# Patient Record
Sex: Female | Born: 2000 | Race: Black or African American | Hispanic: No | Marital: Single | Smoking: Never smoker
Health system: Southern US, Community
[De-identification: ages and names within clinical notes are randomized; demographics above are authoritative.]

## PROBLEM LIST (undated history)

## (undated) DIAGNOSIS — Z2839 Other underimmunization status: Secondary | ICD-10-CM

## (undated) DIAGNOSIS — J45909 Unspecified asthma, uncomplicated: Secondary | ICD-10-CM

## (undated) DIAGNOSIS — Z283 Underimmunization status: Secondary | ICD-10-CM

---
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
## 2019-04-18 ENCOUNTER — Ambulatory Visit (HOSPITAL_COMMUNITY)
Admission: EM | Admit: 2019-04-18 | Discharge: 2019-04-18 | Disposition: A | Discharge status: Home / Self Care | Payer: Medicaid Other | Attending: Family Medicine | Admitting: Family Medicine

## 2019-04-18 ENCOUNTER — Encounter (HOSPITAL_COMMUNITY): Payer: Self-pay

## 2019-04-18 ENCOUNTER — Other Ambulatory Visit: Payer: Self-pay

## 2019-04-18 DIAGNOSIS — S61303A Unspecified open wound of left middle finger with damage to nail, initial encounter: Secondary | ICD-10-CM

## 2019-04-18 DIAGNOSIS — S61302A Unspecified open wound of right middle finger with damage to nail, initial encounter: Secondary | ICD-10-CM | POA: Diagnosis not present

## 2019-04-18 DIAGNOSIS — S61300A Unspecified open wound of right index finger with damage to nail, initial encounter: Secondary | ICD-10-CM

## 2019-04-18 DIAGNOSIS — S61309A Unspecified open wound of unspecified finger with damage to nail, initial encounter: Secondary | ICD-10-CM

## 2019-04-18 DIAGNOSIS — S61304A Unspecified open wound of right ring finger with damage to nail, initial encounter: Secondary | ICD-10-CM

## 2019-04-18 MED ORDER — CEFADROXIL 500 MG PO CAPS: 500.0000 mg | ORAL_CAPSULE | Freq: Two times a day (BID) | ORAL | 0 refills | Status: DC

## 2019-04-18 NOTE — Discharge Instructions (Signed)
Take antibiotic 2 times a day for 5 days.  This is to help prevent infection Take Tylenol or ibuprofen as needed for pain Clean daily and apply a dressing.  Do this for the next several days until the sensitivity of the skin goes down.  After this you can leave them open to air Return here or to your primary care doctor if there is any difficulty with healing over time
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
## 2019-04-24 ENCOUNTER — Other Ambulatory Visit: Payer: Self-pay

## 2019-04-24 ENCOUNTER — Emergency Department (HOSPITAL_COMMUNITY)
Admission: EM | Admit: 2019-04-24 | Discharge: 2019-04-24 | Disposition: A | Discharge status: Home / Self Care | Payer: Medicaid Other | Attending: Emergency Medicine | Admitting: Emergency Medicine

## 2019-04-24 DIAGNOSIS — S61309D Unspecified open wound of unspecified finger with damage to nail, subsequent encounter: Secondary | ICD-10-CM | POA: Insufficient documentation
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
## 2019-04-24 NOTE — Discharge Instructions (Addendum)
You were evaluated in the Emergency Department and after careful evaluation, we did not find any emergent condition requiring admission or further testing in the hospital.  Your exam/testing today is overall reassuring.  Please follow-up with the plastic surgery specialists to help your nails heal.  Use Tylenol or Motrin as needed for discomfort.  Please return to the Emergency Department if you experience any worsening of your condition.  We encourage you to follow up with a primary care provider.  Thank you for allowing [place] to be a part of your care.

## 2019-04-24 NOTE — ED Notes (Signed)
Patient verbalized understanding of discharge instructions. Opportunities for questioning and answers were provided. Armband removed by staff. Patient removed from ED.   
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
## 2019-04-26 ENCOUNTER — Other Ambulatory Visit: Payer: Self-pay

## 2019-04-26 ENCOUNTER — Ambulatory Visit (INDEPENDENT_AMBULATORY_CARE_PROVIDER_SITE_OTHER): Payer: Medicaid Other | Admitting: Plastic Surgery

## 2019-04-26 ENCOUNTER — Encounter: Payer: Self-pay | Admitting: Plastic Surgery

## 2019-04-26 VITALS — BP 121/73 | HR 74 | Temp 98.0°F | Ht 67.0 in | Wt 139.2 lb

## 2019-04-26 DIAGNOSIS — S61309A Unspecified open wound of unspecified finger with damage to nail, initial encounter: Secondary | ICD-10-CM

## 2019-04-26 DIAGNOSIS — S6990XS Unspecified injury of unspecified wrist, hand and finger(s), sequela: Secondary | ICD-10-CM
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
## 2019-05-19 ENCOUNTER — Emergency Department (HOSPITAL_COMMUNITY)
Admission: EM | Admit: 2019-05-19 | Discharge: 2019-05-20 | Discharge status: Against Medical Advice | Payer: Medicaid Other | Attending: Emergency Medicine | Admitting: Emergency Medicine

## 2019-05-19 ENCOUNTER — Other Ambulatory Visit: Payer: Self-pay

## 2019-05-19 ENCOUNTER — Encounter (HOSPITAL_COMMUNITY): Payer: Self-pay

## 2019-05-19 DIAGNOSIS — Z5321 Procedure and treatment not carried out due to patient leaving prior to being seen by health care provider: Secondary | ICD-10-CM | POA: Diagnosis not present

## 2019-05-19 DIAGNOSIS — K6289 Other specified diseases of anus and rectum: Secondary | ICD-10-CM | POA: Diagnosis present

## 2019-05-19 NOTE — ED Triage Notes (Signed)
Pt c/o rectal pain.  No known cause.

## 2019-05-20 NOTE — ED Notes (Signed)
No call for VS x2 

## 2019-05-20 NOTE — ED Notes (Signed)
No call for VS x1. 

## 2019-05-20 NOTE — ED Notes (Signed)
Called pt name multiple times with no response.

## 2019-07-28 ENCOUNTER — Ambulatory Visit: Payer: Medicaid Other | Admitting: Plastic Surgery

## 2019-09-14 ENCOUNTER — Encounter (HOSPITAL_COMMUNITY): Payer: Self-pay

## 2019-09-14 ENCOUNTER — Ambulatory Visit (HOSPITAL_COMMUNITY)
Admission: EM | Admit: 2019-09-14 | Discharge: 2019-09-14 | Disposition: A | Discharge status: Home / Self Care | Payer: Medicaid Other | Attending: Urgent Care | Admitting: Urgent Care

## 2019-09-14 ENCOUNTER — Other Ambulatory Visit: Payer: Self-pay

## 2019-09-14 DIAGNOSIS — J9801 Acute bronchospasm: Secondary | ICD-10-CM | POA: Diagnosis not present

## 2019-09-14 DIAGNOSIS — Z7901 Long term (current) use of anticoagulants: Secondary | ICD-10-CM | POA: Diagnosis not present

## 2019-09-14 DIAGNOSIS — Z20822 Contact with and (suspected) exposure to covid-19: Secondary | ICD-10-CM | POA: Diagnosis not present

## 2019-09-14 DIAGNOSIS — R05 Cough: Secondary | ICD-10-CM | POA: Diagnosis present

## 2019-09-14 DIAGNOSIS — R059 Cough, unspecified: Secondary | ICD-10-CM

## 2019-09-14 DIAGNOSIS — R0982 Postnasal drip: Secondary | ICD-10-CM

## 2019-09-14 DIAGNOSIS — J4530 Mild persistent asthma, uncomplicated: Secondary | ICD-10-CM

## 2019-09-14 DIAGNOSIS — R062 Wheezing: Secondary | ICD-10-CM

## 2019-09-14 MED ORDER — BENZONATATE 100 MG PO CAPS: 100.0000 mg | ORAL_CAPSULE | Freq: Three times a day (TID) | ORAL | 0 refills | Status: DC | PRN

## 2019-09-14 MED ORDER — PSEUDOEPHEDRINE HCL 60 MG PO TABS: 60.0000 mg | ORAL_TABLET | Freq: Three times a day (TID) | ORAL | 0 refills | Status: DC | PRN

## 2019-09-14 MED ORDER — LEVOCETIRIZINE DIHYDROCHLORIDE 5 MG PO TABS: 5.0000 mg | ORAL_TABLET | Freq: Every evening | ORAL | 0 refills | Status: DC

## 2019-09-14 MED ORDER — ALBUTEROL SULFATE HFA 108 (90 BASE) MCG/ACT IN AERS: 1.0000 | INHALATION_SPRAY | Freq: Four times a day (QID) | RESPIRATORY_TRACT | 0 refills | Status: DC | PRN

## 2019-09-14 MED ORDER — PROMETHAZINE-DM 6.25-15 MG/5ML PO SYRP: 5.0000 mL | ORAL_SOLUTION | Freq: Every evening | ORAL | 0 refills | Status: DC | PRN

## 2019-09-14 NOTE — Discharge Instructions (Signed)
Start taking Xyzal daily to address post-nasal drainage as a source of your cough. Use Sudafed to help with the same. This is an as needed medication but start using it the next 3-4 days and then only as needed. In the meantime, you can use Tessalon and Promethazine cough medications. Use your albuterol inhaler once every 4-6 hours as needed for bronchospasms, wheezing. We'll let you know about your COVID results through mychart.

## 2019-09-14 NOTE — ED Triage Notes (Signed)
Pt states she has a cough x 1 week.

## 2019-09-14 NOTE — ED Provider Notes (Signed)
[facility]   MRN: [id number] DOB: 2001/02/27  Subjective:   Holly Johnston is a 19 y.o. female presenting for 1 week history of persisting cough, intermittent wheezing.  Patient has a history of asthma, is out of her albuterol inhaler.  She denies having a sick type feeling.  However, does admit a scratchy throat.  Denies COVID-19 contacts.  Patient is in school now.  No current facility-administered medications for this encounter.  Current Outpatient Medications:  .  cefadroxil (DURICEF) 500 MG capsule, Take 1 capsule (500 mg total) by mouth 2 (two) times daily., Disp: 10 capsule, Rfl: 0 .  hydrocortisone 2.5 % cream, APPLY DAILY TO THE AFFECTED AREAS ON FACE, Disp: , Rfl:  .  triamcinolone cream (KENALOG) 0.1 %, Apply topically See admin instructions., Disp: , Rfl:    Allergies  Allergen Reactions  . Amoxicillin Rash    Past Medical History:  Diagnosis Date  . Asthma   . Not immunized      No past surgical history on file.  Family History  Problem Relation Age of Onset  . Healthy Mother   . Healthy Father     Social History   Tobacco Use  . Smoking status: Never Smoker  . Smokeless tobacco: Never Used  Substance Use Topics  . Alcohol use: No  . Drug use: No    Review of Systems  Constitutional: Negative for fever and malaise/fatigue.  HENT: Negative for congestion, ear pain, sinus pain and sore throat.   Eyes: Negative for discharge and redness.  Respiratory: Positive for cough and wheezing. Negative for hemoptysis and shortness of breath.   Cardiovascular: Negative for chest pain.  Gastrointestinal: Negative for abdominal pain, diarrhea, nausea and vomiting.  Genitourinary: Negative for dysuria, flank pain and hematuria.  Musculoskeletal: Negative for myalgias.  Skin: Negative for rash.  Neurological: Negative for dizziness, weakness and headaches.  Psychiatric/Behavioral: Negative for depression and substance abuse.     Objective:    Vitals: BP 125/78 (BP Location: Right Arm)   Pulse 74   Temp 98.9 F (37.2 C) (Oral)   Resp 18   Wt 137 lb (62.1 kg)   LMP 08/20/2019   SpO2 100%   BMI 21.46 kg/m   Physical Exam Constitutional:      General: She is not in acute distress.    Appearance: Normal appearance. She is well-developed. She is not ill-appearing, toxic-appearing or diaphoretic.  HENT:     Head: Normocephalic and atraumatic.     Right Ear: Tympanic membrane and ear canal normal. No drainage or tenderness. No middle ear effusion. Tympanic membrane is not erythematous.     Left Ear: Tympanic membrane and ear canal normal. No drainage or tenderness.  No middle ear effusion. Tympanic membrane is not erythematous.     Nose: Nose normal. No congestion or rhinorrhea.     Mouth/Throat:     Mouth: Mucous membranes are moist. No oral lesions.     Pharynx: No pharyngeal swelling, oropharyngeal exudate, posterior oropharyngeal erythema or uvula swelling.     Tonsils: No tonsillar exudate or tonsillar abscesses.     Comments: Thick streak of post-nasal drainage overlying pharynx.  Eyes:     General: No scleral icterus.    Extraocular Movements: Extraocular movements intact.     Right eye: Normal extraocular motion.     Left eye: Normal extraocular motion.     Conjunctiva/sclera: Conjunctivae normal.     Pupils: Pupils are equal, round, and reactive to light.  Cardiovascular:     Rate and Rhythm: Normal rate and regular rhythm.     Pulses: Normal pulses.     Heart sounds: Normal heart sounds. No murmur. No friction rub. No gallop.   Pulmonary:     Effort: Pulmonary effort is normal. No respiratory distress.     Breath sounds: No stridor. Wheezing (mild, over mid lung fields bilaterally) present. No rhonchi or rales.  Musculoskeletal:     Cervical back: Normal range of motion and neck supple.  Lymphadenopathy:     Cervical: No cervical adenopathy.  Skin:    General: Skin is warm and dry.     Findings: No  rash.  Neurological:     General: No focal deficit present.     Mental Status: She is alert and oriented to person, place, and time.  Psychiatric:        Mood and Affect: Mood normal.        Behavior: Behavior normal.        Thought Content: Thought content normal.        Judgment: Judgment normal.      Assessment and Plan :   1. Cough   2. Wheezing   3. Bronchospasm   4. Mild persistent asthma without complication   5. Post-nasal drainage     COVID-19 testing pending.  Suspect patient is having bronchospasms and postnasal drainage related to her asthma and seasonal change.  Recommended supportive care, refilled her albuterol inhaler.  Due to reassuring physical exam findings, hold off on chest x-ray. Counseled patient on potential for adverse effects with medications prescribed/recommended today, ER and return-to-clinic precautions discussed, patient verbalized understanding.    Jaynee Eagles, PA-C 09/14/19 1236

## 2019-09-15 LAB — SARS CORONAVIRUS 2 (TAT 6-24 HRS): SARS Coronavirus 2: NEGATIVE

## 2019-12-09 DIAGNOSIS — Z419 Encounter for procedure for purposes other than remedying health state, unspecified: Secondary | ICD-10-CM | POA: Diagnosis not present

## 2020-01-09 DIAGNOSIS — Z419 Encounter for procedure for purposes other than remedying health state, unspecified: Secondary | ICD-10-CM | POA: Diagnosis not present

## 2020-02-09 DIAGNOSIS — Z419 Encounter for procedure for purposes other than remedying health state, unspecified: Secondary | ICD-10-CM | POA: Diagnosis not present

## 2020-03-10 DIAGNOSIS — Z419 Encounter for procedure for purposes other than remedying health state, unspecified: Secondary | ICD-10-CM | POA: Diagnosis not present

## 2020-04-10 DIAGNOSIS — Z419 Encounter for procedure for purposes other than remedying health state, unspecified: Secondary | ICD-10-CM | POA: Diagnosis not present

## 2020-04-12 ENCOUNTER — Other Ambulatory Visit: Payer: Self-pay

## 2020-04-12 ENCOUNTER — Encounter (HOSPITAL_COMMUNITY): Payer: Self-pay | Admitting: Emergency Medicine

## 2020-04-12 ENCOUNTER — Emergency Department (HOSPITAL_COMMUNITY)
Admission: EM | Admit: 2020-04-12 | Discharge: 2020-04-12 | Disposition: A | Discharge status: Home / Self Care | Payer: Medicaid Other | Attending: Emergency Medicine | Admitting: Emergency Medicine

## 2020-04-12 DIAGNOSIS — Z202 Contact with and (suspected) exposure to infections with a predominantly sexual mode of transmission: Secondary | ICD-10-CM | POA: Insufficient documentation

## 2020-04-12 DIAGNOSIS — J45909 Unspecified asthma, uncomplicated: Secondary | ICD-10-CM | POA: Insufficient documentation

## 2020-04-12 DIAGNOSIS — Z113 Encounter for screening for infections with a predominantly sexual mode of transmission: Secondary | ICD-10-CM

## 2020-04-12 LAB — URINALYSIS, ROUTINE W REFLEX MICROSCOPIC
Bilirubin Urine: NEGATIVE
Glucose, UA: NEGATIVE mg/dL
Hgb urine dipstick: NEGATIVE
Ketones, ur: NEGATIVE mg/dL
Nitrite: NEGATIVE
Protein, ur: 30 mg/dL — AB
Specific Gravity, Urine: 1.028 (ref 1.005–1.030)
pH: 8 (ref 5.0–8.0)

## 2020-04-12 LAB — HIV ANTIBODY (ROUTINE TESTING W REFLEX): HIV Screen 4th Generation wRfx: NONREACTIVE

## 2020-04-12 LAB — I-STAT BETA HCG BLOOD, ED (MC, WL, AP ONLY): I-stat hCG, quantitative: <5 m[IU]/mL (ref <5)

## 2020-04-12 NOTE — Discharge Instructions (Addendum)
You have been screened for potential sexually transmitted infection.  Check MyChart, link below in the next several days for results.

## 2020-04-12 NOTE — ED Triage Notes (Signed)
Pt states she would like to be tested for STDs. Pt states the only way she would like to be tested in by getting blood drawn.

## 2020-04-12 NOTE — ED Notes (Signed)
Lab called and stated STD swab was not labeled and they cannot run the test, Patient already gone from the department.
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
## 2020-04-13 LAB — RPR: RPR Ser Ql: NONREACTIVE

## 2020-04-21 DIAGNOSIS — Z13220 Encounter for screening for lipoid disorders: Secondary | ICD-10-CM | POA: Diagnosis not present

## 2020-04-21 DIAGNOSIS — Z131 Encounter for screening for diabetes mellitus: Secondary | ICD-10-CM | POA: Diagnosis not present

## 2020-04-21 DIAGNOSIS — N760 Acute vaginitis: Secondary | ICD-10-CM | POA: Diagnosis not present

## 2020-04-21 DIAGNOSIS — Z0000 Encounter for general adult medical examination without abnormal findings: Secondary | ICD-10-CM | POA: Diagnosis not present

## 2020-04-21 DIAGNOSIS — S0341XD Sprain of jaw, right side, subsequent encounter: Secondary | ICD-10-CM | POA: Diagnosis not present

## 2020-04-21 DIAGNOSIS — J4520 Mild intermittent asthma, uncomplicated: Secondary | ICD-10-CM | POA: Diagnosis not present

## 2020-04-26 DIAGNOSIS — M26601 Right temporomandibular joint disorder, unspecified: Secondary | ICD-10-CM | POA: Diagnosis not present

## 2020-04-26 DIAGNOSIS — E559 Vitamin D deficiency, unspecified: Secondary | ICD-10-CM | POA: Diagnosis not present

## 2020-04-26 DIAGNOSIS — N760 Acute vaginitis: Secondary | ICD-10-CM | POA: Diagnosis not present

## 2020-04-26 DIAGNOSIS — Z131 Encounter for screening for diabetes mellitus: Secondary | ICD-10-CM | POA: Diagnosis not present

## 2020-04-26 DIAGNOSIS — Z0000 Encounter for general adult medical examination without abnormal findings: Secondary | ICD-10-CM | POA: Diagnosis not present

## 2020-04-26 DIAGNOSIS — Z13220 Encounter for screening for lipoid disorders: Secondary | ICD-10-CM | POA: Diagnosis not present

## 2020-05-10 DIAGNOSIS — Z419 Encounter for procedure for purposes other than remedying health state, unspecified: Secondary | ICD-10-CM | POA: Diagnosis not present

## 2020-05-10 DIAGNOSIS — M26601 Right temporomandibular joint disorder, unspecified: Secondary | ICD-10-CM | POA: Diagnosis not present

## 2020-05-10 DIAGNOSIS — J4520 Mild intermittent asthma, uncomplicated: Secondary | ICD-10-CM | POA: Diagnosis not present

## 2020-06-10 DIAGNOSIS — Z419 Encounter for procedure for purposes other than remedying health state, unspecified: Secondary | ICD-10-CM | POA: Diagnosis not present

## 2020-07-11 DIAGNOSIS — Z419 Encounter for procedure for purposes other than remedying health state, unspecified: Secondary | ICD-10-CM | POA: Diagnosis not present

## 2020-08-01 ENCOUNTER — Encounter (HOSPITAL_COMMUNITY): Payer: Self-pay | Admitting: Emergency Medicine

## 2020-08-01 ENCOUNTER — Other Ambulatory Visit: Payer: Self-pay

## 2020-08-01 ENCOUNTER — Emergency Department (HOSPITAL_COMMUNITY)
Admission: EM | Admit: 2020-08-01 | Discharge: 2020-08-02 | Disposition: A | Discharge status: Home / Self Care | Payer: Commercial Managed Care - PPO | Attending: Emergency Medicine | Admitting: Emergency Medicine

## 2020-08-01 DIAGNOSIS — H1089 Other conjunctivitis: Secondary | ICD-10-CM | POA: Insufficient documentation

## 2020-08-01 DIAGNOSIS — H02841 Edema of right upper eyelid: Secondary | ICD-10-CM | POA: Diagnosis present

## 2020-08-01 DIAGNOSIS — H109 Unspecified conjunctivitis: Secondary | ICD-10-CM

## 2020-08-01 DIAGNOSIS — J45909 Unspecified asthma, uncomplicated: Secondary | ICD-10-CM | POA: Insufficient documentation

## 2020-08-01 MED ORDER — FLUORESCEIN SODIUM 1 MG OP STRP
1.0000 | ORAL_STRIP | Freq: Once | OPHTHALMIC | Status: DC
  Filled 2020-08-01: qty 1

## 2020-08-01 MED ORDER — TETRACAINE HCL 0.5 % OP SOLN
2.0000 [drp] | Freq: Once | OPHTHALMIC | Status: DC
  Filled 2020-08-01: qty 4

## 2020-08-01 NOTE — ED Triage Notes (Signed)
Patient reports right eye irritation from her eyelashes this morning with redness /drainage and blurred vision .
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
## 2020-08-02 DIAGNOSIS — H1089 Other conjunctivitis: Secondary | ICD-10-CM | POA: Diagnosis not present

## 2020-08-02 LAB — GC/CHLAMYDIA PROBE AMP (~~LOC~~) NOT AT ARMC
Chlamydia: NEGATIVE
Comment: NEGATIVE
Comment: NORMAL
Neisseria Gonorrhea: NEGATIVE

## 2020-08-02 MED ORDER — DOXYCYCLINE HYCLATE 100 MG PO TABS
100.0000 mg | ORAL_TABLET | Freq: Once | ORAL | Status: AC
  Administered 2020-08-02: 100 mg via ORAL
  Filled 2020-08-02: qty 1

## 2020-08-02 MED ORDER — GATIFLOXACIN 0.5 % OP SOLN
1.0000 [drp] | Freq: Four times a day (QID) | OPHTHALMIC | Status: DC
  Administered 2020-08-02: 1 [drp] via OPHTHALMIC
  Filled 2020-08-02: qty 2.5

## 2020-08-02 MED ORDER — DOXYCYCLINE HYCLATE 100 MG PO CAPS: 100.0000 mg | ORAL_CAPSULE | Freq: Two times a day (BID) | ORAL | 0 refills | Status: DC

## 2020-08-02 NOTE — ED Notes (Signed)
Pt verbalized understanding of d/c instructions, medication and follow up. Pt ambulatory to WR with steady gait, NAD

## 2020-08-08 DIAGNOSIS — Z419 Encounter for procedure for purposes other than remedying health state, unspecified: Secondary | ICD-10-CM | POA: Diagnosis not present

## 2020-09-08 DIAGNOSIS — Z419 Encounter for procedure for purposes other than remedying health state, unspecified: Secondary | ICD-10-CM | POA: Diagnosis not present

## 2020-10-08 DIAGNOSIS — Z419 Encounter for procedure for purposes other than remedying health state, unspecified: Secondary | ICD-10-CM | POA: Diagnosis not present

## 2020-11-08 DIAGNOSIS — Z419 Encounter for procedure for purposes other than remedying health state, unspecified: Secondary | ICD-10-CM | POA: Diagnosis not present

## 2020-12-08 DIAGNOSIS — Z419 Encounter for procedure for purposes other than remedying health state, unspecified: Secondary | ICD-10-CM | POA: Diagnosis not present

## 2020-12-27 ENCOUNTER — Emergency Department (HOSPITAL_COMMUNITY)
Admission: EM | Admit: 2020-12-27 | Discharge: 2020-12-28 | Disposition: A | Discharge status: Home / Self Care | Payer: Medicaid Other | Attending: Emergency Medicine | Admitting: Emergency Medicine

## 2020-12-27 ENCOUNTER — Other Ambulatory Visit: Payer: Self-pay

## 2020-12-27 ENCOUNTER — Emergency Department (HOSPITAL_COMMUNITY): Payer: Medicaid Other

## 2020-12-27 ENCOUNTER — Encounter (HOSPITAL_COMMUNITY): Payer: Self-pay | Admitting: Emergency Medicine

## 2020-12-27 DIAGNOSIS — M79672 Pain in left foot: Secondary | ICD-10-CM | POA: Insufficient documentation

## 2020-12-27 DIAGNOSIS — M7989 Other specified soft tissue disorders: Secondary | ICD-10-CM | POA: Diagnosis not present

## 2020-12-27 DIAGNOSIS — M79671 Pain in right foot: Secondary | ICD-10-CM | POA: Diagnosis not present

## 2020-12-27 DIAGNOSIS — R200 Anesthesia of skin: Secondary | ICD-10-CM | POA: Insufficient documentation

## 2020-12-27 DIAGNOSIS — Z5321 Procedure and treatment not carried out due to patient leaving prior to being seen by health care provider: Secondary | ICD-10-CM | POA: Insufficient documentation

## 2020-12-27 LAB — PREGNANCY, URINE: Preg Test, Ur: NEGATIVE
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
## 2020-12-27 NOTE — ED Triage Notes (Signed)
Patient coming from home, complaint of numbness in the feet and blood coming thorough skin on top of feet.

## 2020-12-28 ENCOUNTER — Encounter (HOSPITAL_COMMUNITY): Payer: Self-pay

## 2020-12-28 ENCOUNTER — Ambulatory Visit (HOSPITAL_COMMUNITY)
Admission: EM | Admit: 2020-12-28 | Discharge: 2020-12-28 | Disposition: A | Discharge status: Home / Self Care | Payer: Medicaid Other | Attending: Emergency Medicine | Admitting: Emergency Medicine

## 2020-12-28 ENCOUNTER — Other Ambulatory Visit: Payer: Self-pay

## 2020-12-28 DIAGNOSIS — M722 Plantar fascial fibromatosis: Secondary | ICD-10-CM

## 2020-12-28 MED ORDER — PREDNISONE 20 MG PO TABS: 40.0000 mg | ORAL_TABLET | Freq: Every day | ORAL | 0 refills | Status: DC

## 2020-12-28 NOTE — Discharge Instructions (Addendum)
Take prednisone  (2 Pills) every morning with food for 5 days, at completion take ibuprofen 800 mg three times a day with food then can use as need  Inside your packet are exercises that can be done to help stretch the fascia, may start once the pain starts to subside   Can apply heat compresses or pad to area in 15 minutes intervals  Elevate feet with in sitting and lying position to help with swelling  Water comfortable supportive shoes until symptoms subside

## 2020-12-28 NOTE — ED Provider Notes (Signed)
[facility]    CSN: [id number] Arrival date & time: 12/28/20  2703      History   Chief Complaint Chief Complaint  Patient presents with   Foot Swelling    HPI Holly Johnston is a 20 y.o. female.   Patient presents with bilateral foot pain and swelling with numbness starting at the midfoot radiating into all five toes for three days after wearing stiletto heels intermittently for three days straight. Also feel several times, unknown number, while in heels. Has scattered abrasions and bruising Painful to bear weight and has limited movement of toes.   Past Medical History:  Diagnosis Date   Asthma    Not immunized     Patient Active Problem List   Diagnosis Date Noted   Not immunized 10/28/2014    History reviewed. No pertinent surgical history.  OB History   No obstetric history on file.      Home Medications    Prior to Admission medications   Medication Sig Start Date End Date Taking? Authorizing Provider  predniSONE (DELTASONE) 20 MG tablet Take 2 tablets (40 mg total) by mouth daily. 12/28/20  Yes Lavante Toso R, NP  albuterol (VENTOLIN HFA) 108 (90 Base) MCG/ACT inhaler Inhale 1-2 puffs into the lungs every 6 (six) hours as needed for wheezing or shortness of breath. 09/14/19   Wallis Bamberg, PA-C  benzonatate (TESSALON) 100 MG capsule Take 1-2 capsules (100-200 mg total) by mouth 3 (three) times daily as needed. 09/14/19   Wallis Bamberg, PA-C  cefadroxil (DURICEF) 500 MG capsule Take 1 capsule (500 mg total) by mouth 2 (two) times daily. 04/18/19   Eustace Moore, MD  doxycycline (VIBRAMYCIN) 100 MG capsule Take 1 capsule (100 mg total) by mouth 2 (two) times daily. 08/02/20   Gilda Crease, MD  hydrocortisone 2.5 % cream APPLY DAILY TO THE AFFECTED AREAS ON FACE 01/21/19   [provider]  levocetirizine (XYZAL) 5 MG tablet Take 1 tablet (5 mg total) by mouth every evening. 09/14/19   Wallis Bamberg, PA-C  promethazine-dextromethorphan  (PROMETHAZINE-DM) 6.25-15 MG/5ML syrup Take 5 mLs by mouth at bedtime as needed for cough. 09/14/19   Wallis Bamberg, PA-C  pseudoephedrine (SUDAFED) 60 MG tablet Take 1 tablet (60 mg total) by mouth every 8 (eight) hours as needed for congestion. 09/14/19   Wallis Bamberg, PA-C  triamcinolone cream (KENALOG) 0.1 % Apply topically See admin instructions. 02/25/19   [provider]  omeprazole (PRILOSEC) 20 MG capsule Take 1 capsule (20 mg total) by mouth daily. 03/07/17 04/18/19  Sherrilee Gilles, NP    Family History Family History  Problem Relation Age of Onset   Healthy Mother    Healthy Father     Social History Social History   Tobacco Use   Smoking status: Never   Smokeless tobacco: Never  Vaping Use   Vaping Use: Never used  Substance Use Topics   Alcohol use: No   Drug use: No     Allergies   Amoxicillin   Review of Systems Review of Systems Defer to HPI   Physical Exam Triage Vital Signs ED Triage Vitals  Enc Vitals Group     BP 12/28/20 0836 132/77     Pulse Rate 12/28/20 0836 78     Resp 12/28/20 0836 18     Temp 12/28/20 0836 98.3 F (36.8 C)     Temp Source 12/28/20 0836 Oral     SpO2 12/28/20 0836 100 %  Weight --      Height --      Head Circumference --      Peak Flow --      Pain Score 12/28/20 0835 10     Pain Loc --      Pain Edu? --      Excl. in GC? --    No data found.  Updated Vital Signs BP 132/77 (BP Location: Left Arm)   Pulse 78   Temp 98.3 F (36.8 C) (Oral)   Resp 18   LMP 12/02/2020 (Exact Date)   SpO2 100%   Visual Acuity Right Eye Distance:   Left Eye Distance:   Bilateral Distance:    Right Eye Near:   Left Eye Near:    Bilateral Near:     Physical Exam Constitutional:      Appearance: Normal appearance.  HENT:     Head: Normocephalic.  Eyes:     Extraocular Movements: Extraocular movements intact.  Pulmonary:     Effort: Pulmonary effort is normal.  Musculoskeletal:       Legs:     Comments:  Diffuse bilateral swelling of ankle and feet, bilateral tenderness of central forefoot, decreased sensation along all five toes bilaterally, capillary refill less than 3, 2+ pedal pulses bilaterally, abrasions on bilateral posterior ankles, small blister at base of right metatarsal, small abrasion on near base of right metatarsal, ecchymosis on medial left midfoot, ROM intact of bilateral ankles, ROM intact of bilateral foot   Neurological:     General: No focal deficit present.     Mental Status: She is alert and oriented to person, place, and time. Mental status is at baseline.  Psychiatric:        Mood and Affect: Mood normal.        Behavior: Behavior normal.     UC Treatments / Results  Labs (all labs ordered are listed, but only abnormal results are displayed) Labs Reviewed - No data to display  EKG   Radiology DG Foot Complete Left  Result Date: 12/27/2020 CLINICAL DATA:  Left foot pain and swelling EXAM: LEFT FOOT - COMPLETE 3+ VIEW COMPARISON:  None. FINDINGS: There is no evidence of fracture or dislocation. There is no evidence of arthropathy or other focal bone abnormality. Soft tissues are unremarkable. IMPRESSION: Negative. Electronically Signed   By: Helyn Numbers MD   On: 12/27/2020 19:23   DG Foot Complete Right  Result Date: 12/27/2020 CLINICAL DATA:  Right foot pain and swelling EXAM: RIGHT FOOT COMPLETE - 3+ VIEW COMPARISON:  None. FINDINGS: There is no evidence of fracture or dislocation. There is no evidence of arthropathy or other focal bone abnormality. Soft tissues are unremarkable. IMPRESSION: Negative. Electronically Signed   By: Helyn Numbers MD   On: 12/27/2020 19:22    Procedures Procedures (including critical care time)  Medications Ordered in UC Medications - No data to display  Initial Impression / Assessment and Plan / UC Course  I have reviewed the triage vital signs and the nursing notes.  Pertinent labs & imaging results that were available  during my care of the patient were reviewed by me and considered in my medical decision making (see chart for details).  Bilateral plantar fasciitis  Prednisone 40 mg daily  for 5 days, at completion ibuprofen 800 mg tid for 5 days then prn Heating pads to areas in 15 minute intervals Stretching of fascia once pain decreases Elevation of feet with dependent  Supportive padded footwear  until pain subsides   Final Clinical Impressions(s) / UC Diagnoses   Final diagnoses:  Bilateral plantar fasciitis     Discharge Instructions      Take prednisone  (2 Pills) every morning with food for 5 days, at completion take ibuprofen 800 mg three times a day with food then can use as need  Inside your packet are exercises that can be done to help stretch the fascia, may start once the pain starts to subside   Can apply heat compresses or pad to area in 15 minutes intervals  Elevate feet with in sitting and lying position to help with swelling  Water comfortable supportive shoes until symptoms subside    ED Prescriptions     Medication Sig Dispense Auth. Provider   predniSONE (DELTASONE) 20 MG tablet Take 2 tablets (40 mg total) by mouth daily. 10 tablet Valinda Hoar, NP      PDMP not reviewed this encounter.   Valinda Hoar, [place] 12/28/20 [phone]

## 2020-12-28 NOTE — ED Notes (Signed)
Pt did not respond when called for vitals check, not seen in or outside of lobby  

## 2020-12-28 NOTE — ED Notes (Signed)
Pt did not respond during vitals check X1 

## 2020-12-28 NOTE — ED Triage Notes (Addendum)
Pt in with c/o bilateral foot swelling and pain x 3 days  Pt states her feet are swelling and she has also noticed some bleeding   States she wore high heels for 3 days straight and fell a few times, but doesn't remember actually injuring herself  Pt states she does feel like her toes are numb and she has limited ROM in feet

## 2021-01-02 ENCOUNTER — Other Ambulatory Visit: Payer: Self-pay

## 2021-01-02 ENCOUNTER — Ambulatory Visit (INDEPENDENT_AMBULATORY_CARE_PROVIDER_SITE_OTHER): Payer: Medicaid Other | Admitting: Orthopaedic Surgery

## 2021-01-02 ENCOUNTER — Other Ambulatory Visit: Payer: Self-pay | Admitting: Physician Assistant

## 2021-01-02 DIAGNOSIS — M79672 Pain in left foot: Secondary | ICD-10-CM | POA: Diagnosis not present

## 2021-01-02 DIAGNOSIS — M79671 Pain in right foot: Secondary | ICD-10-CM

## 2021-01-02 MED ORDER — DICLOFENAC SODIUM 75 MG PO TBEC: 75.0000 mg | DELAYED_RELEASE_TABLET | Freq: Two times a day (BID) | ORAL | 0 refills | Status: DC | PRN

## 2021-01-02 NOTE — Progress Notes (Signed)
   Office Visit Note   Patient: Holly Johnston           Date of Birth: 01-29-2001           MRN: [id number] Visit Date: 01/02/2021              Requested by: [facility] [address] [place],  [place] [address] PCP: [facility]   Assessment & Plan: Visit Diagnoses:  1. Bilateral foot pain     Plan: Impression is bilateral foot metatarsalgia.  I believe this will improve with time and appropriate shoewear.  Have called in an anti-inflammatory to take as needed when she finishes the steroid pack tomorrow.  She will follow-up with [place] as needed.  Call with concerns or questions.  Follow-Up Instructions: Return if symptoms worsen or fail to improve.   Orders:  No orders of the defined types were placed in this encounter.  No orders of the defined types were placed in this encounter.     Procedures: No procedures performed   Clinical Data: No additional findings.   Subjective: Chief Complaint  Patient presents with   Left Foot - Pain   Right Foot - Pain    HPI patient is a pleasant 20 year old female who comes in today with bilateral foot pain both equally as bad.  This began after wearing stiletto's on her birthday which was on 12/24/2021.  No specific injury.  She was seen in urgent care a few days ago where x-rays were obtained.  These were negative for acute findings.  She was started on a steroid taper and has not noticed any improvement.  She is scheduled to finish this tomorrow.  The pain she has is primarily when she is resting with her feet up.  No paresthesias.  Review of Systems as detailed in HPI.  All others reviewed and are negative.   Objective: Vital Signs: There were no vitals taken for this visit.  Physical Exam well-developed well-nourished female in no acute distress.  Alert and oriented x3.  Ortho Exam bilateral foot exam shows diffuse pain across to the dorsum of the foot.  No pain with range  of motion.  No swelling.  She is neurovascular intact distally.  Specialty Comments:  No specialty comments available.  Imaging: No new imaging   PMFS History: Patient Active Problem List   Diagnosis Date Noted   Not immunized 10/28/2014   Past Medical History:  Diagnosis Date   Asthma    Not immunized     Family History  Problem Relation Age of Onset   Healthy Mother    Healthy Father     No past surgical history on file. Social History   Occupational History   Not on file  Tobacco Use   Smoking status: Never   Smokeless tobacco: Never  Vaping Use   Vaping Use: Never used  Substance and Sexual Activity   Alcohol use: No   Drug use: No   Sexual activity: Not on file

## 2021-01-08 DIAGNOSIS — Z419 Encounter for procedure for purposes other than remedying health state, unspecified: Secondary | ICD-10-CM | POA: Diagnosis not present

## 2021-02-08 DIAGNOSIS — Z419 Encounter for procedure for purposes other than remedying health state, unspecified: Secondary | ICD-10-CM | POA: Diagnosis not present

## 2021-03-10 DIAGNOSIS — Z419 Encounter for procedure for purposes other than remedying health state, unspecified: Secondary | ICD-10-CM | POA: Diagnosis not present

## 2021-04-10 DIAGNOSIS — Z419 Encounter for procedure for purposes other than remedying health state, unspecified: Secondary | ICD-10-CM | POA: Diagnosis not present

## 2021-05-10 DIAGNOSIS — Z419 Encounter for procedure for purposes other than remedying health state, unspecified: Secondary | ICD-10-CM | POA: Diagnosis not present

## 2021-06-10 DIAGNOSIS — Z419 Encounter for procedure for purposes other than remedying health state, unspecified: Secondary | ICD-10-CM | POA: Diagnosis not present

## 2021-06-18 ENCOUNTER — Other Ambulatory Visit: Payer: Self-pay

## 2021-06-18 ENCOUNTER — Encounter (HOSPITAL_COMMUNITY): Payer: Self-pay

## 2021-06-18 ENCOUNTER — Ambulatory Visit (HOSPITAL_COMMUNITY)
Admission: EM | Admit: 2021-06-18 | Discharge: 2021-06-18 | Disposition: A | Discharge status: Home / Self Care | Payer: Medicaid Other | Attending: Emergency Medicine | Admitting: Emergency Medicine

## 2021-06-18 DIAGNOSIS — J310 Chronic rhinitis: Secondary | ICD-10-CM | POA: Diagnosis not present

## 2021-06-18 DIAGNOSIS — J069 Acute upper respiratory infection, unspecified: Secondary | ICD-10-CM

## 2021-06-18 MED ORDER — FLUTICASONE PROPIONATE 50 MCG/ACT NA SUSP: 2.0000 | Freq: Every day | NASAL | 0 refills | Status: AC

## 2021-06-18 NOTE — ED Provider Notes (Signed)
[facility]    CSN: [id number] Arrival date & time: 06/18/21  1458    HISTORY   Chief Complaint  Patient presents with   Nasal Congestion   HPI Holly Johnston is a 21 y.o. female. Pt c/o nasal congestion with heavy mucus x9 days.  States had a neg covid test 3 days ago.    The history is provided by the patient.  Past Medical History:  Diagnosis Date   Asthma    Not immunized    Patient Active Problem List   Diagnosis Date Noted   Not immunized 10/28/2014   History reviewed. No pertinent surgical history. OB History   No obstetric history on file.    Home Medications    Prior to Admission medications   Medication Sig Start Date End Date Taking? Authorizing Provider  omeprazole (PRILOSEC) 20 MG capsule Take 1 capsule (20 mg total) by mouth daily. 03/07/17 04/18/19  Sherrilee Gilles, NP   Family History Family History  Problem Relation Age of Onset   Healthy Mother    Healthy Father    Social History Social History   Tobacco Use   Smoking status: Never   Smokeless tobacco: Never  Vaping Use   Vaping Use: Never used  Substance Use Topics   Alcohol use: No   Drug use: No   Allergies   Amoxicillin  Review of Systems Review of Systems Pertinent findings noted in history of present illness.   Physical Exam Triage Vital Signs ED Triage Vitals  Enc Vitals Group     BP 04/06/21 0827 (!) 147/82     Pulse Rate 04/06/21 0827 72     Resp 04/06/21 0827 18     Temp 04/06/21 0827 98.3 F (36.8 C)     Temp Source 04/06/21 0827 Oral     SpO2 04/06/21 0827 98 %     Weight --      Height --      Head Circumference --      Peak Flow --      Pain Score 04/06/21 0826 5     Pain Loc --      Pain Edu? --      Excl. in GC? --   No data found.  Updated Vital Signs BP 137/62 (BP Location: Left Arm)    Pulse 82    Temp 99.2 F (37.3 C) (Oral)    Resp 18    LMP 05/23/2021    SpO2 96%   Physical Exam Vitals and nursing note reviewed.   Constitutional:      General: She is not in acute distress.    Appearance: Normal appearance. She is not ill-appearing.  HENT:     Head: Normocephalic and atraumatic.     Salivary Glands: Right salivary gland is not diffusely enlarged or tender. Left salivary gland is not diffusely enlarged or tender.     Right Ear: Tympanic membrane, ear canal and external ear normal. No drainage. No middle ear effusion. There is no impacted cerumen. Tympanic membrane is not erythematous or bulging.     Left Ear: Tympanic membrane, ear canal and external ear normal. No drainage.  No middle ear effusion. There is no impacted cerumen. Tympanic membrane is not erythematous or bulging.     Nose: Rhinorrhea present. No nasal deformity, septal deviation, mucosal edema or congestion. Rhinorrhea is clear.     Right Turbinates: Not enlarged, swollen or pale.     Left Turbinates: Not enlarged, swollen or  pale.     Right Sinus: No maxillary sinus tenderness or frontal sinus tenderness.     Left Sinus: No maxillary sinus tenderness or frontal sinus tenderness.     Mouth/Throat:     Lips: Pink. No lesions.     Mouth: Mucous membranes are moist. No oral lesions.     Pharynx: Oropharynx is clear. Uvula midline. No posterior oropharyngeal erythema or uvula swelling.     Tonsils: No tonsillar exudate. 0 on the right. 0 on the left.  Eyes:     General: Lids are normal.        Right eye: No discharge.        Left eye: No discharge.     Extraocular Movements: Extraocular movements intact.     Conjunctiva/sclera: Conjunctivae normal.     Right eye: Right conjunctiva is not injected.     Left eye: Left conjunctiva is not injected.  Neck:     Trachea: Trachea and phonation normal.  Cardiovascular:     Rate and Rhythm: Normal rate and regular rhythm.     Pulses: Normal pulses.     Heart sounds: Normal heart sounds. No murmur heard.   No friction rub. No gallop.  Pulmonary:     Effort: Pulmonary effort is normal. No  accessory muscle usage, prolonged expiration or respiratory distress.     Breath sounds: Normal breath sounds. No stridor, decreased air movement or transmitted upper airway sounds. No decreased breath sounds, wheezing, rhonchi or rales.  Chest:     Chest wall: No tenderness.  Musculoskeletal:        General: Normal range of motion.     Cervical back: Normal range of motion and neck supple. Normal range of motion.  Lymphadenopathy:     Cervical: No cervical adenopathy.  Skin:    General: Skin is warm and dry.     Findings: No erythema or rash.  Neurological:     General: No focal deficit present.     Mental Status: She is alert and oriented to person, place, and time.  Psychiatric:        Mood and Affect: Mood normal.        Behavior: Behavior normal.    Visual Acuity Right Eye Distance:   Left Eye Distance:   Bilateral Distance:    Right Eye Near:   Left Eye Near:    Bilateral Near:     UC Couse / Diagnostics / Procedures:    EKG  Radiology No results found.  Procedures Procedures (including critical care time)  UC Diagnoses / Final Clinical Impressions(s)   I have reviewed the triage vital signs and the nursing notes.  Pertinent labs & imaging results that were available during my care of the patient were reviewed by me and considered in my medical decision making (see chart for details).   Final diagnoses:  Viral upper respiratory tract infection  Rhinitis, unspecified type   Upper respiratory infection, resolving.  Begin Flonase.  Conservative care recommended.  ED Prescriptions     Medication Sig Dispense Auth. Provider   fluticasone (FLONASE) 50 MCG/ACT nasal spray Place 2 sprays into both nostrils daily. 18 mL Theadora Rama Scales, PA-C      PDMP not reviewed this encounter.  Pending results:  Labs Reviewed - No data to display  Medications Ordered in UC: Medications - No data to display  Disposition Upon Discharge:  Condition: stable for  discharge home Home: take medications as prescribed; routine discharge instructions as discussed;  follow up as advised.  Patient presented with an acute illness with associated systemic symptoms and significant discomfort requiring urgent management. In my opinion, this is a condition that a prudent lay person (someone who possesses an average knowledge of health and medicine) may potentially expect to result in complications if not addressed urgently such as respiratory distress, impairment of bodily function or dysfunction of bodily organs.   Routine symptom specific, illness specific and/or disease specific instructions were discussed with the patient and/or caregiver at length.   As such, the patient has been evaluated and assessed, work-up was performed and treatment was provided in alignment with urgent care protocols and evidence based medicine.  Patient/parent/caregiver has been advised that the patient may require follow up for further testing and treatment if the symptoms continue in spite of treatment, as clinically indicated and appropriate.  If the patient was tested for COVID-19, Influenza and/or RSV, then the patient/parent/guardian was advised to isolate at home pending the results of his/her diagnostic coronavirus test and potentially longer if theyre positive. I have also advised pt that if his/her COVID-19 test returns positive, it's recommended to self-isolate for at least 10 days after symptoms first appeared AND until fever-free for 24 hours without fever reducer AND other symptoms have improved or resolved. Discussed self-isolation recommendations as well as instructions for household member/close contacts as per the [facility] and [place] [facility], and also gave patient the COVID packet with this information.  Patient/parent/caregiver has been [facility] in 3-5 days if no better; to PCP or the Emergency Department if new signs and symptoms develop, or if the current signs  or symptoms continue to change or worsen for further workup, evaluation and treatment as clinically indicated and appropriate  The patient will follow up with their current PCP if and as advised. If the patient does not currently have a PCP we will assist them in obtaining one.   The patient may need specialty follow up if the symptoms continue, in spite of conservative treatment and management, for further workup, evaluation, consultation and treatment as clinically indicated and appropriate.  Patient/parent/caregiver verbalized understanding and agreement of plan as discussed.  All questions were addressed during visit.  Please see discharge instructions below for further details of plan.  Discharge Instructions:   Discharge Instructions      At this time, you are suffering from the remnants of an upper respiratory infection.  Due to the duration of your symptoms, it is no longer possible to know exactly what it was that you had.  I recommend that you begin Flonase to dry out mucus membranes, decrease mucus production and sinus drainage.      This office note has been dictated using Teaching laboratory technicianDragon speech recognition software.  Unfortunately, and despite my best efforts, this method of dictation can sometimes lead to occasional typographical or grammatical errors.  I apologize in advance if this occurs.     Theadora RamaMorgan, Vernetta Dizdarevic Scales, [place]-C 06/18/21 [id number]

## 2021-06-18 NOTE — Discharge Instructions (Signed)
At this time, you are suffering from the remnants of an upper respiratory infection.  Due to the duration of your symptoms, it is no longer possible to know exactly what it was that you had.  I recommend that you begin Flonase to dry out mucus membranes, decrease mucus production and sinus drainage.

## 2021-06-18 NOTE — ED Triage Notes (Signed)
Pt c/o nasal congestion with heavy mucus x9 days. States had a neg covid test 3 days ago.

## 2021-06-19 DIAGNOSIS — Z113 Encounter for screening for infections with a predominantly sexual mode of transmission: Secondary | ICD-10-CM | POA: Diagnosis not present

## 2021-06-19 DIAGNOSIS — Z114 Encounter for screening for human immunodeficiency virus [HIV]: Secondary | ICD-10-CM | POA: Diagnosis not present

## 2021-07-11 DIAGNOSIS — Z419 Encounter for procedure for purposes other than remedying health state, unspecified: Secondary | ICD-10-CM | POA: Diagnosis not present

## 2021-08-08 DIAGNOSIS — Z419 Encounter for procedure for purposes other than remedying health state, unspecified: Secondary | ICD-10-CM | POA: Diagnosis not present

## 2021-09-08 DIAGNOSIS — Z419 Encounter for procedure for purposes other than remedying health state, unspecified: Secondary | ICD-10-CM | POA: Diagnosis not present

## 2021-10-08 DIAGNOSIS — Z419 Encounter for procedure for purposes other than remedying health state, unspecified: Secondary | ICD-10-CM | POA: Diagnosis not present

## 2021-11-08 DIAGNOSIS — Z419 Encounter for procedure for purposes other than remedying health state, unspecified: Secondary | ICD-10-CM | POA: Diagnosis not present

## 2021-12-08 DIAGNOSIS — Z419 Encounter for procedure for purposes other than remedying health state, unspecified: Secondary | ICD-10-CM | POA: Diagnosis not present

## 2022-01-08 DIAGNOSIS — Z419 Encounter for procedure for purposes other than remedying health state, unspecified: Secondary | ICD-10-CM | POA: Diagnosis not present

## 2022-02-08 DIAGNOSIS — Z419 Encounter for procedure for purposes other than remedying health state, unspecified: Secondary | ICD-10-CM | POA: Diagnosis not present

## 2022-03-10 DIAGNOSIS — Z419 Encounter for procedure for purposes other than remedying health state, unspecified: Secondary | ICD-10-CM | POA: Diagnosis not present

## 2022-04-10 DIAGNOSIS — Z419 Encounter for procedure for purposes other than remedying health state, unspecified: Secondary | ICD-10-CM | POA: Diagnosis not present

## 2022-05-10 DIAGNOSIS — Z419 Encounter for procedure for purposes other than remedying health state, unspecified: Secondary | ICD-10-CM | POA: Diagnosis not present

## 2022-06-10 DIAGNOSIS — Z419 Encounter for procedure for purposes other than remedying health state, unspecified: Secondary | ICD-10-CM | POA: Diagnosis not present

## 2022-07-11 DIAGNOSIS — Z419 Encounter for procedure for purposes other than remedying health state, unspecified: Secondary | ICD-10-CM | POA: Diagnosis not present

## 2022-08-09 DIAGNOSIS — Z419 Encounter for procedure for purposes other than remedying health state, unspecified: Secondary | ICD-10-CM | POA: Diagnosis not present

## 2022-09-09 DIAGNOSIS — Z419 Encounter for procedure for purposes other than remedying health state, unspecified: Secondary | ICD-10-CM | POA: Diagnosis not present

## 2022-10-09 DIAGNOSIS — Z419 Encounter for procedure for purposes other than remedying health state, unspecified: Secondary | ICD-10-CM | POA: Diagnosis not present

## 2022-11-09 DIAGNOSIS — Z419 Encounter for procedure for purposes other than remedying health state, unspecified: Secondary | ICD-10-CM | POA: Diagnosis not present

## 2022-12-09 DIAGNOSIS — Z419 Encounter for procedure for purposes other than remedying health state, unspecified: Secondary | ICD-10-CM | POA: Diagnosis not present

## 2023-01-09 DIAGNOSIS — Z419 Encounter for procedure for purposes other than remedying health state, unspecified: Secondary | ICD-10-CM | POA: Diagnosis not present

## 2023-02-09 DIAGNOSIS — Z419 Encounter for procedure for purposes other than remedying health state, unspecified: Secondary | ICD-10-CM | POA: Diagnosis not present

## 2023-03-11 DIAGNOSIS — Z419 Encounter for procedure for purposes other than remedying health state, unspecified: Secondary | ICD-10-CM | POA: Diagnosis not present

## 2023-07-20 IMAGING — DX DG FOOT COMPLETE 3+V*L*
3 series · 3 of 3 positions shown · non-contrast
Comparison: None.

CLINICAL DATA: Left foot pain and swelling

EXAM:
LEFT FOOT - COMPLETE 3+ VIEW

[foot ap]
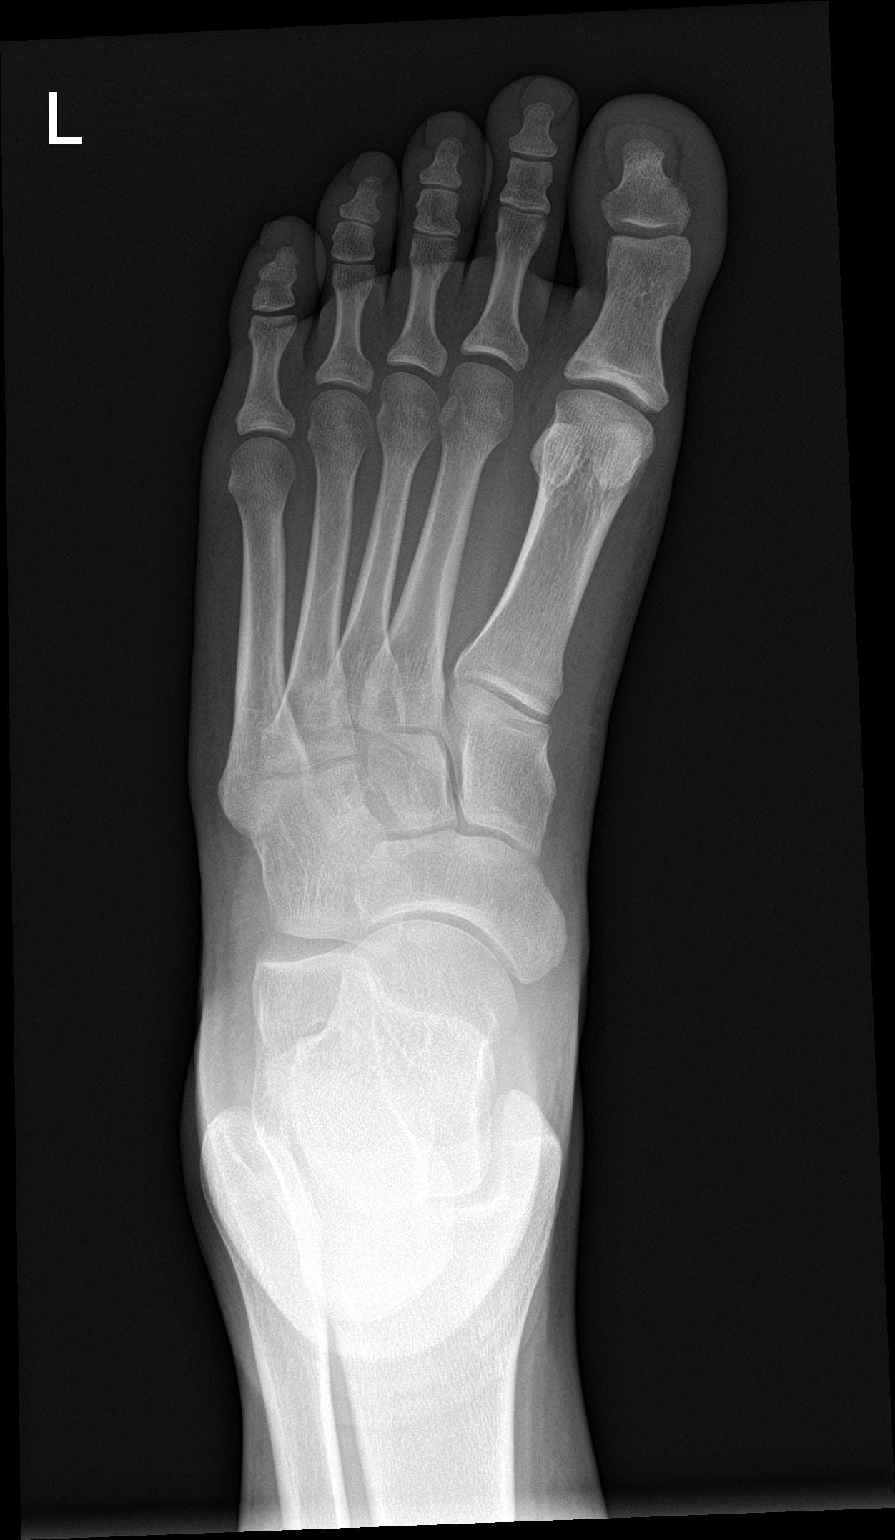

[foot obl]
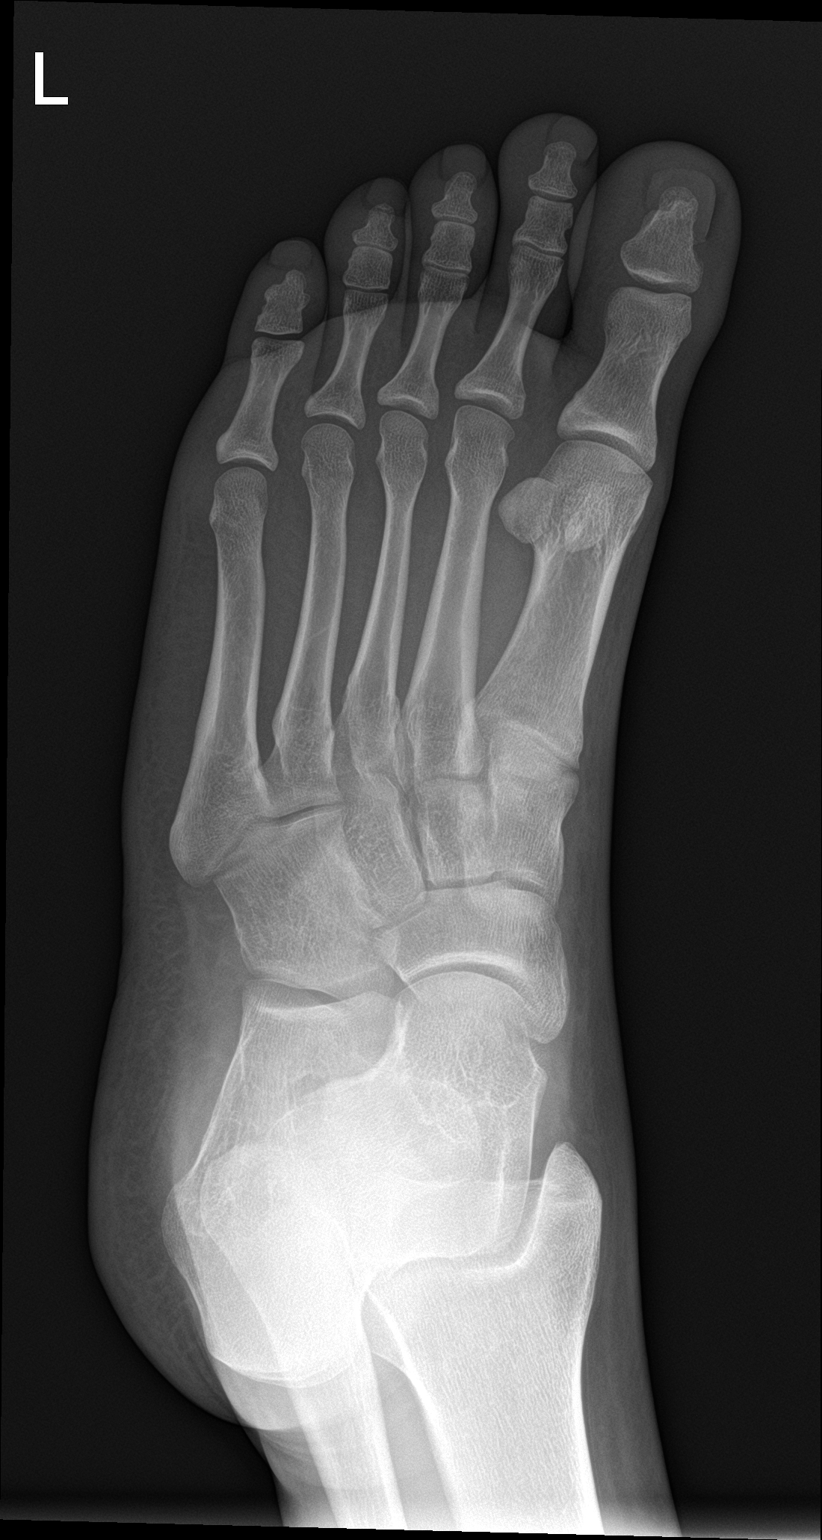

[foot lat]
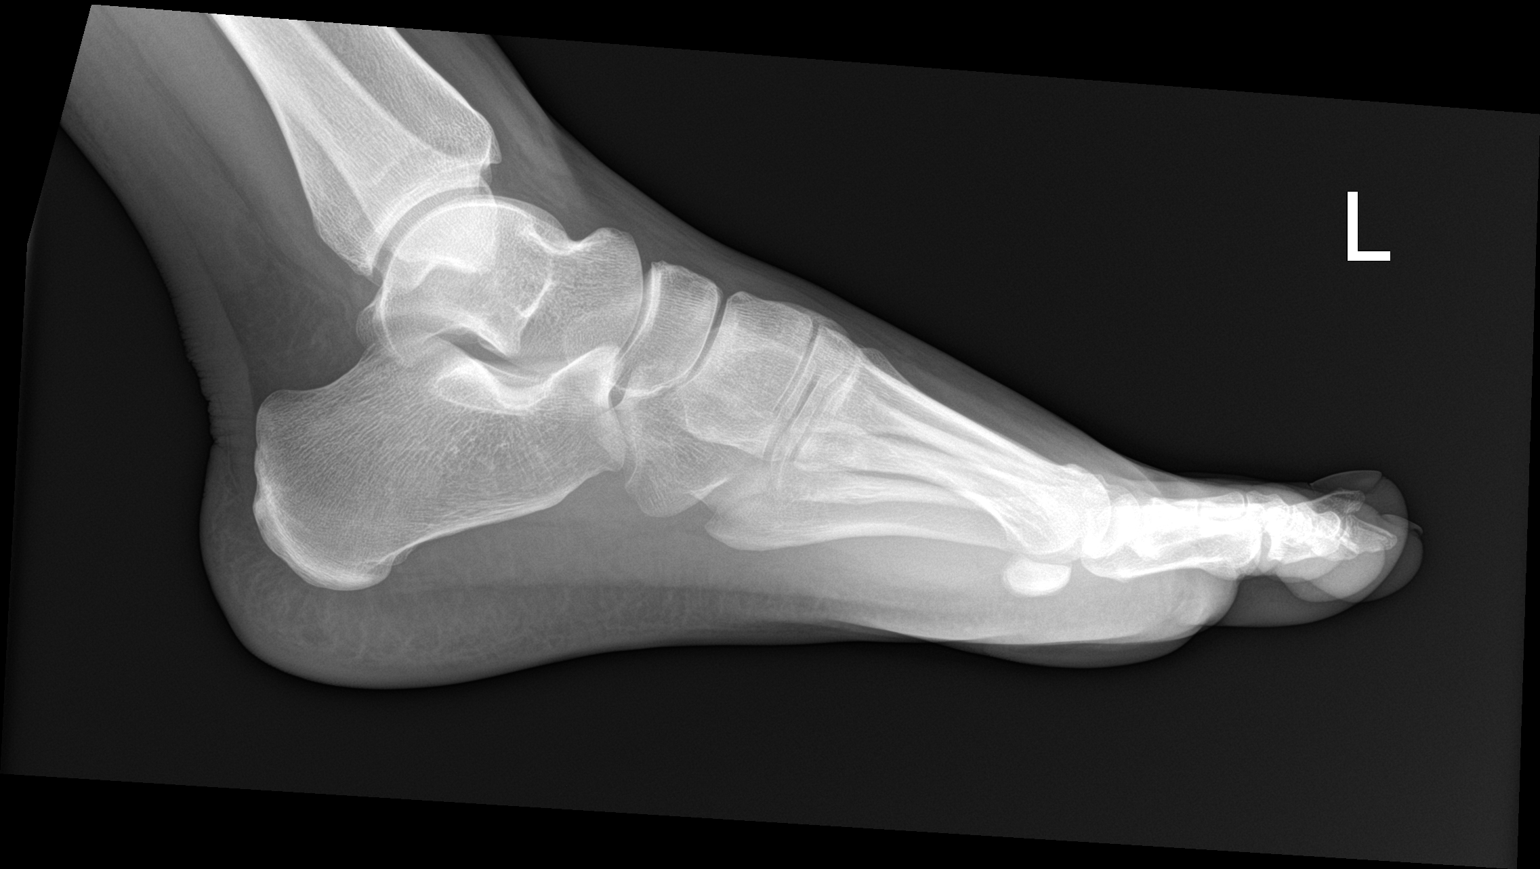

[3 of 3 positions shown; findings below may reference images not displayed]

FINDINGS: There is no evidence of fracture or dislocation. There is no
evidence of arthropathy or other focal bone abnormality. Soft
tissues are unremarkable.
IMPRESSION: Negative.

## 2023-07-20 IMAGING — DX DG FOOT COMPLETE 3+V*R*
3 series · 3 of 3 positions shown · non-contrast
Comparison: None.

CLINICAL DATA: Right foot pain and swelling

EXAM:
RIGHT FOOT COMPLETE - 3+ VIEW

[foot ap]
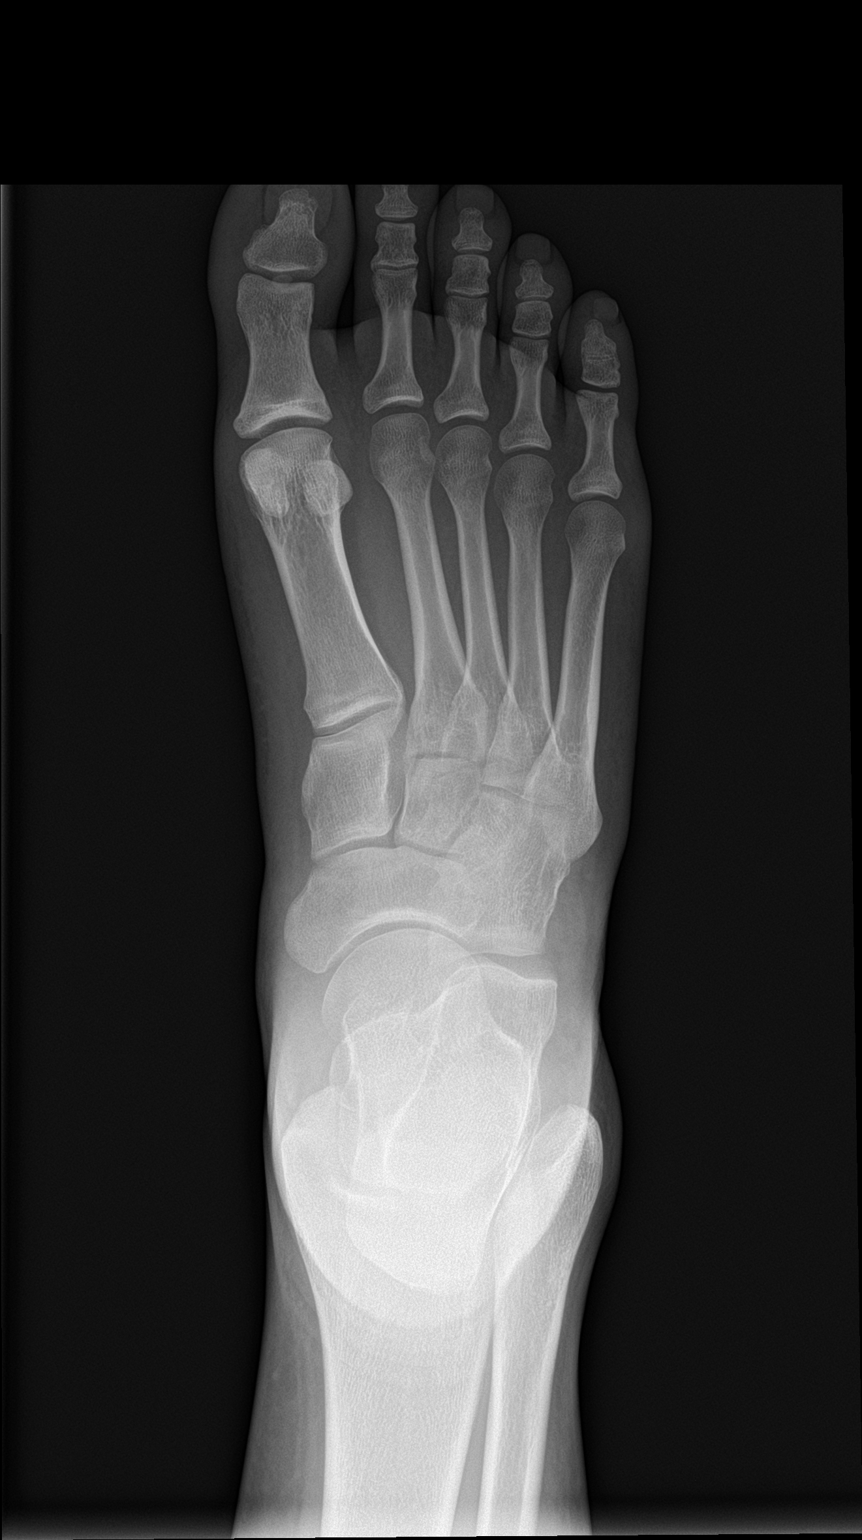

[foot obl]
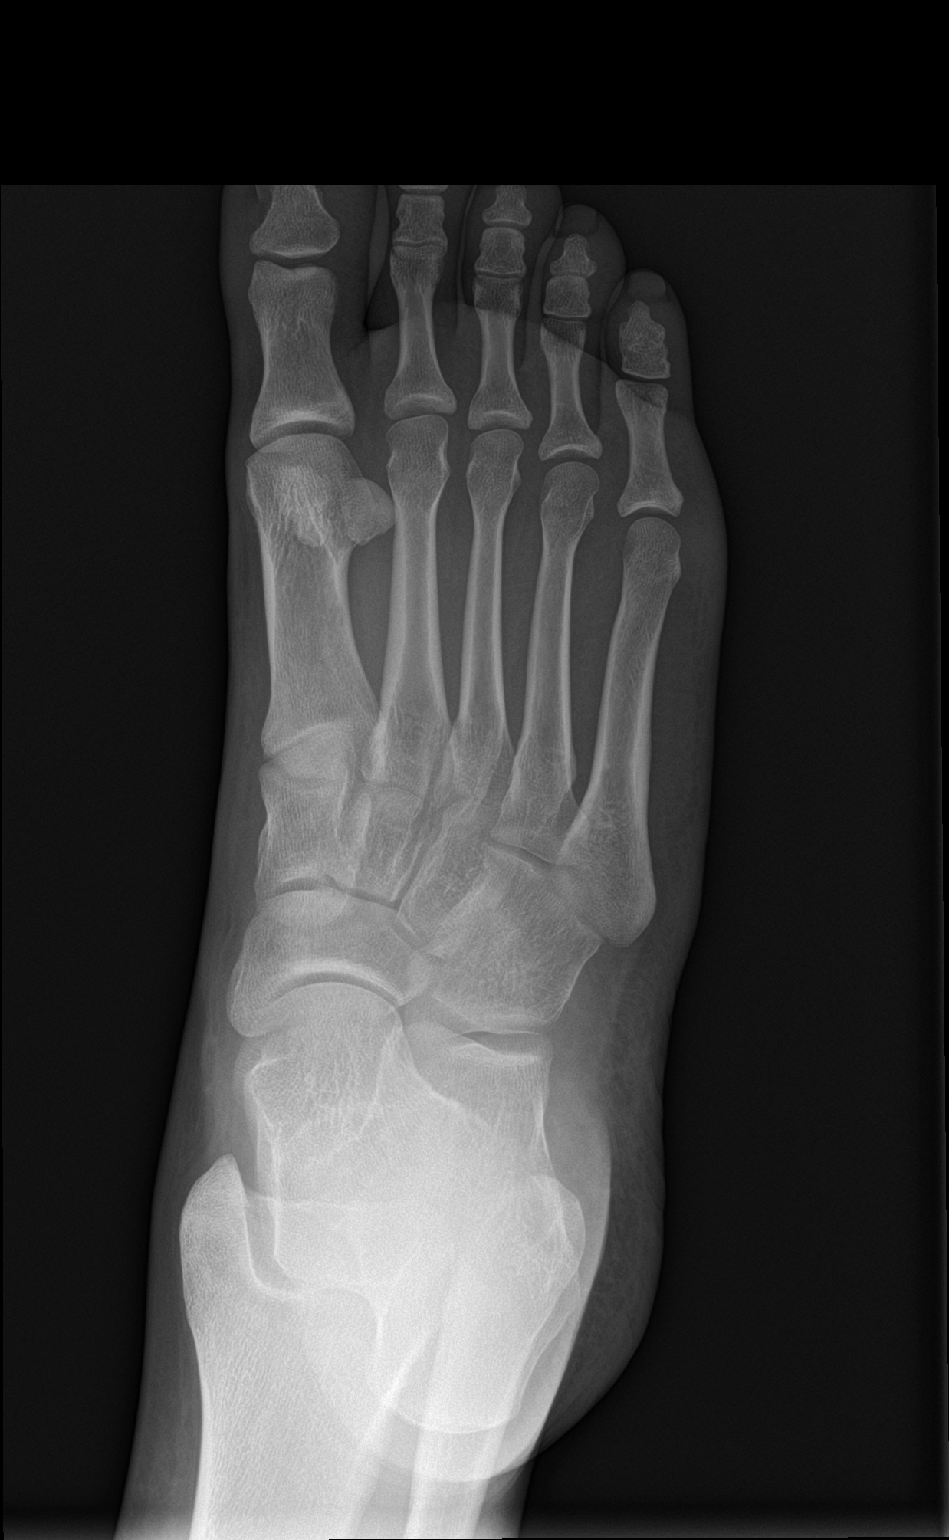

[foot lat]
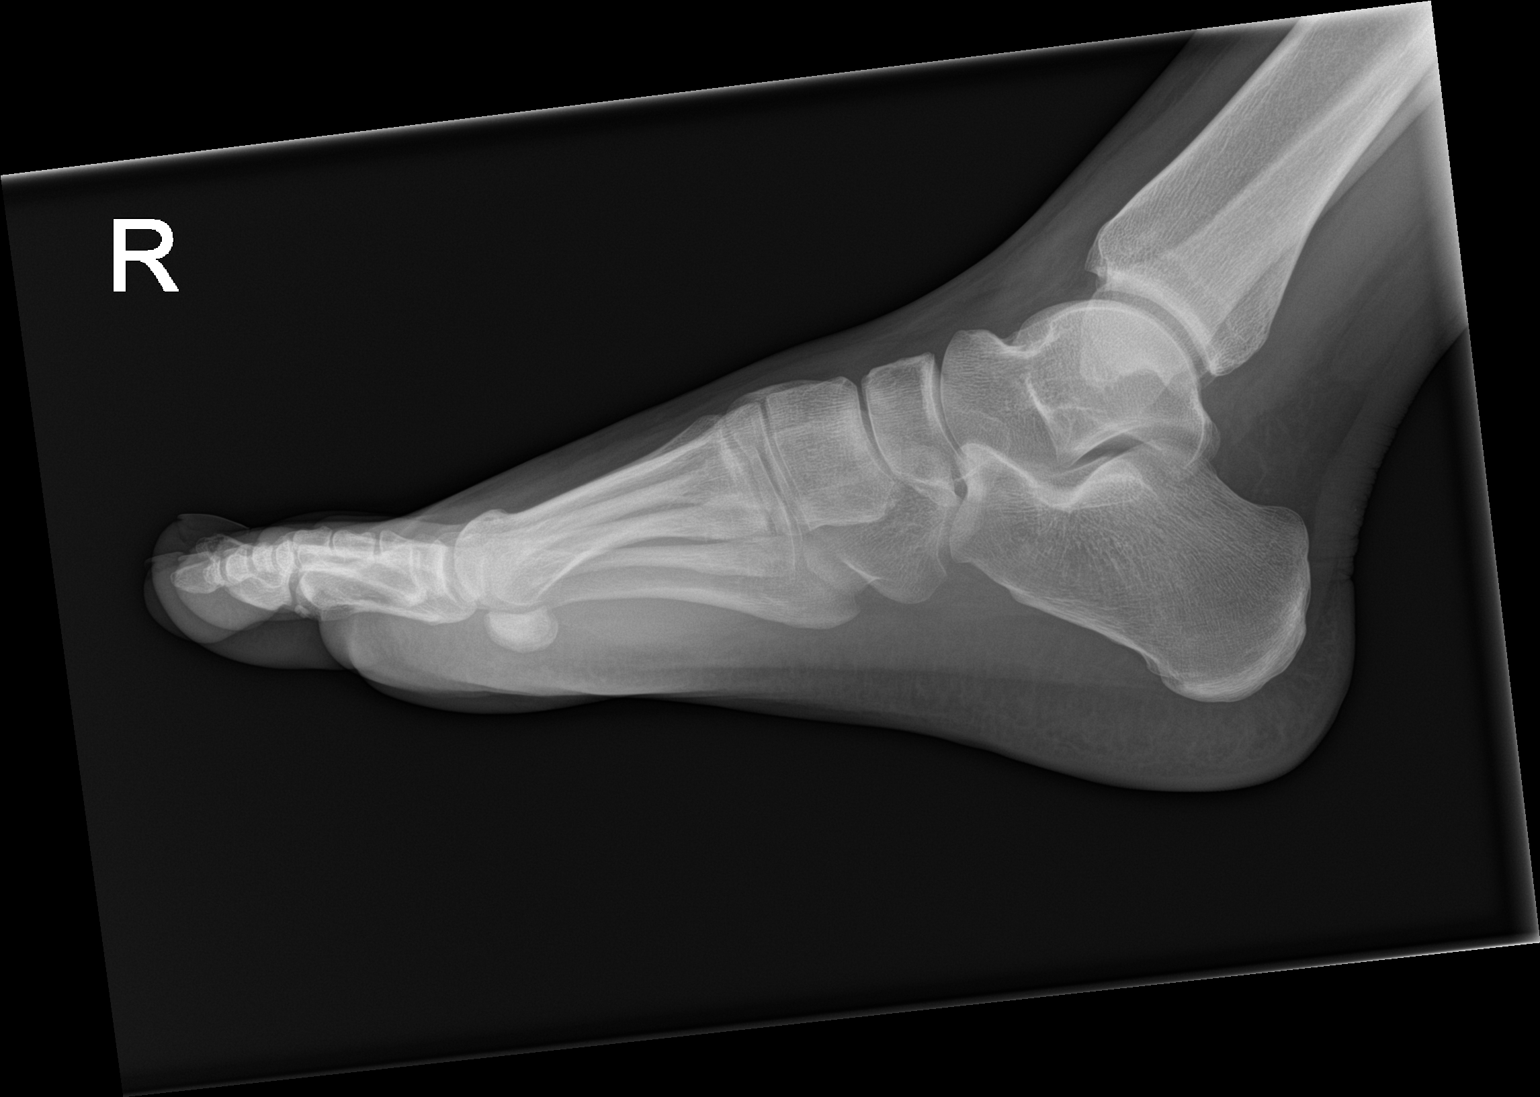

[3 of 3 positions shown; findings below may reference images not displayed]

FINDINGS: There is no evidence of fracture or dislocation. There is no
evidence of arthropathy or other focal bone abnormality. Soft
tissues are unremarkable.
IMPRESSION: Negative.
